# Patient Record
Sex: Female | Born: 1995 | Hispanic: No | Marital: Single | Smoking: Never smoker
Health system: Southern US, Community
[De-identification: ages and names within clinical notes are randomized; demographics above are authoritative.]

## PROBLEM LIST (undated history)

## (undated) DIAGNOSIS — Z789 Other specified health status: Secondary | ICD-10-CM

## (undated) HISTORY — PX: NO PAST SURGERIES: SHX2092

---
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
## 2018-06-02 ENCOUNTER — Encounter (HOSPITAL_COMMUNITY): Payer: Self-pay | Admitting: *Deleted

## 2018-06-06 ENCOUNTER — Encounter (HOSPITAL_COMMUNITY): Payer: Self-pay

## 2018-06-06 ENCOUNTER — Ambulatory Visit (HOSPITAL_BASED_OUTPATIENT_CLINIC_OR_DEPARTMENT_OTHER)
Admission: RE | Admit: 2018-06-06 | Discharge: 2018-06-06 | Disposition: A | Discharge status: Home / Self Care | Payer: Managed Care, Other (non HMO) | Source: Ambulatory Visit | Attending: Specialist | Admitting: Specialist

## 2018-06-06 ENCOUNTER — Other Ambulatory Visit (HOSPITAL_COMMUNITY): Payer: Self-pay | Admitting: Obstetrics and Gynecology

## 2018-06-06 ENCOUNTER — Other Ambulatory Visit (HOSPITAL_COMMUNITY): Payer: Self-pay | Admitting: *Deleted

## 2018-06-06 ENCOUNTER — Ambulatory Visit (HOSPITAL_COMMUNITY)
Admission: RE | Admit: 2018-06-06 | Discharge: 2018-06-06 | Disposition: A | Discharge status: Home / Self Care | Payer: Managed Care, Other (non HMO) | Source: Ambulatory Visit | Attending: Specialist | Admitting: Specialist

## 2018-06-06 DIAGNOSIS — Z3682 Encounter for antenatal screening for nuchal translucency: Secondary | ICD-10-CM

## 2018-06-06 DIAGNOSIS — Z3A13 13 weeks gestation of pregnancy: Secondary | ICD-10-CM

## 2018-06-06 DIAGNOSIS — Q513 Bicornate uterus: Principal | ICD-10-CM

## 2018-06-06 DIAGNOSIS — Z8489 Family history of other specified conditions: Secondary | ICD-10-CM

## 2018-06-06 DIAGNOSIS — O3402 Maternal care for unspecified congenital malformation of uterus, second trimester: Secondary | ICD-10-CM

## 2018-06-06 DIAGNOSIS — O34591 Maternal care for other abnormalities of gravid uterus, first trimester: Secondary | ICD-10-CM | POA: Diagnosis not present

## 2018-06-06 DIAGNOSIS — O34599 Maternal care for other abnormalities of gravid uterus, unspecified trimester: Secondary | ICD-10-CM | POA: Insufficient documentation

## 2018-06-06 HISTORY — DX: Other specified health status: Z78.9

## 2018-06-06 NOTE — ED Notes (Signed)
Patient in session with Genetic Counselor. 

## 2018-06-22 ENCOUNTER — Other Ambulatory Visit: Payer: Self-pay

## 2018-06-28 ENCOUNTER — Encounter (HOSPITAL_COMMUNITY): Payer: Self-pay

## 2018-07-18 ENCOUNTER — Other Ambulatory Visit (HOSPITAL_COMMUNITY): Payer: Managed Care, Other (non HMO)

## 2018-07-21 ENCOUNTER — Ambulatory Visit (HOSPITAL_COMMUNITY)
Admission: RE | Admit: 2018-07-21 | Discharge: 2018-07-21 | Disposition: A | Discharge status: Home / Self Care | Payer: Managed Care, Other (non HMO) | Source: Ambulatory Visit | Attending: Obstetrics and Gynecology | Admitting: Obstetrics and Gynecology

## 2018-07-21 ENCOUNTER — Encounter (HOSPITAL_COMMUNITY): Payer: Self-pay

## 2018-07-21 ENCOUNTER — Other Ambulatory Visit (HOSPITAL_COMMUNITY): Payer: Self-pay | Admitting: *Deleted

## 2018-07-21 DIAGNOSIS — Z3A19 19 weeks gestation of pregnancy: Secondary | ICD-10-CM | POA: Insufficient documentation

## 2018-07-21 DIAGNOSIS — Z363 Encounter for antenatal screening for malformations: Secondary | ICD-10-CM | POA: Diagnosis not present

## 2018-07-21 DIAGNOSIS — Q513 Bicornate uterus: Secondary | ICD-10-CM | POA: Insufficient documentation

## 2018-07-21 DIAGNOSIS — O34592 Maternal care for other abnormalities of gravid uterus, second trimester: Secondary | ICD-10-CM | POA: Insufficient documentation

## 2018-07-21 DIAGNOSIS — Z8279 Family history of other congenital malformations, deformations and chromosomal abnormalities: Secondary | ICD-10-CM | POA: Insufficient documentation

## 2018-07-21 DIAGNOSIS — O3400 Maternal care for unspecified congenital malformation of uterus, unspecified trimester: Secondary | ICD-10-CM

## 2018-07-21 DIAGNOSIS — O3402 Maternal care for unspecified congenital malformation of uterus, second trimester: Secondary | ICD-10-CM

## 2018-08-18 ENCOUNTER — Ambulatory Visit (HOSPITAL_COMMUNITY)
Admission: RE | Admit: 2018-08-18 | Discharge: 2018-08-18 | Disposition: A | Discharge status: Home / Self Care | Payer: BLUE CROSS/BLUE SHIELD | Source: Ambulatory Visit | Attending: Specialist | Admitting: Specialist

## 2018-08-18 ENCOUNTER — Other Ambulatory Visit (HOSPITAL_COMMUNITY): Payer: Self-pay | Admitting: *Deleted

## 2018-08-18 ENCOUNTER — Encounter (HOSPITAL_COMMUNITY): Payer: Self-pay

## 2018-08-18 DIAGNOSIS — O3400 Maternal care for unspecified congenital malformation of uterus, unspecified trimester: Secondary | ICD-10-CM

## 2018-08-18 DIAGNOSIS — Q513 Bicornate uterus: Principal | ICD-10-CM

## 2018-08-18 DIAGNOSIS — O34592 Maternal care for other abnormalities of gravid uterus, second trimester: Secondary | ICD-10-CM | POA: Diagnosis not present

## 2018-08-18 DIAGNOSIS — Z3A23 23 weeks gestation of pregnancy: Secondary | ICD-10-CM | POA: Diagnosis not present

## 2018-08-18 DIAGNOSIS — O3402 Maternal care for unspecified congenital malformation of uterus, second trimester: Secondary | ICD-10-CM | POA: Insufficient documentation

## 2018-08-18 DIAGNOSIS — Z362 Encounter for other antenatal screening follow-up: Secondary | ICD-10-CM | POA: Diagnosis not present

## 2018-09-15 ENCOUNTER — Ambulatory Visit (HOSPITAL_COMMUNITY): Payer: BLUE CROSS/BLUE SHIELD

## 2018-09-15 ENCOUNTER — Encounter (HOSPITAL_COMMUNITY): Payer: Self-pay

## 2019-03-04 ENCOUNTER — Encounter (HOSPITAL_COMMUNITY): Payer: Self-pay

## 2019-11-18 IMAGING — US US MFM OB TRANSVAGINAL
1 series · 14 of 28 positions shown · non-contrast
Comparison: none

[Series 1: us mfm ob transvaginal · 45 acquisitions, 14 frames shown]
[im 2/45]
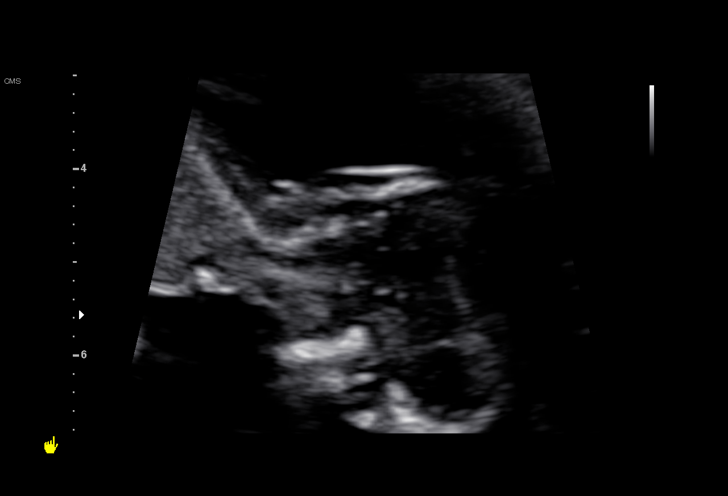
[im 5/45]
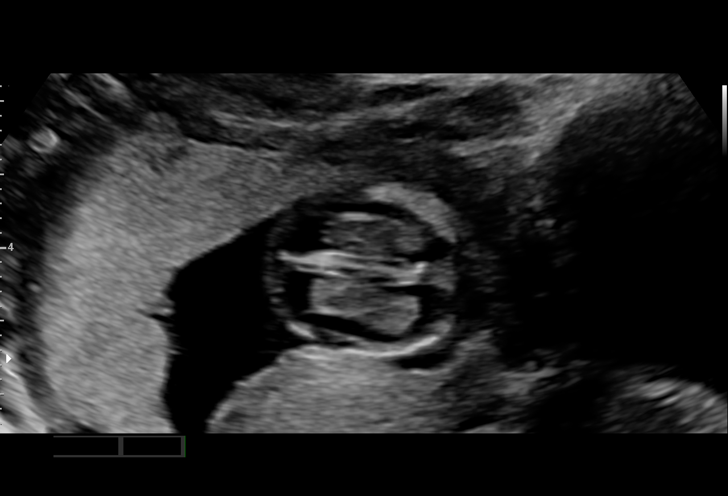
[im 9/45]
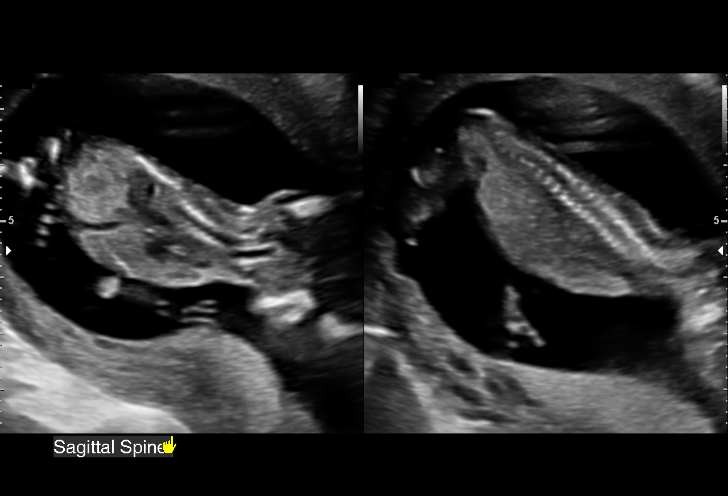
[im 12/45]
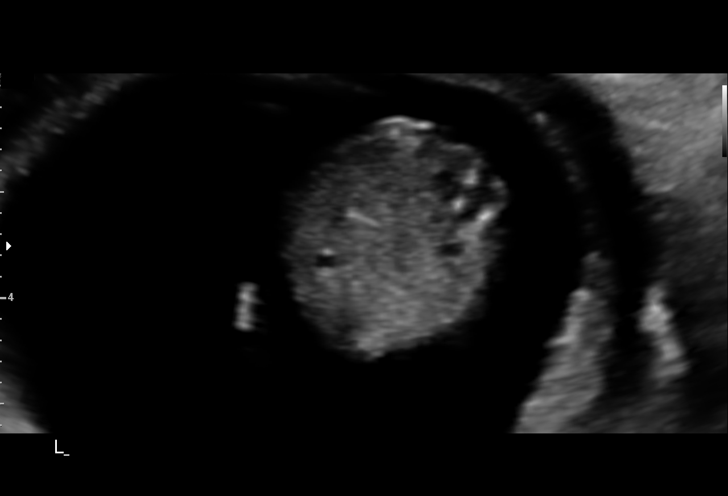
[im 15/45]
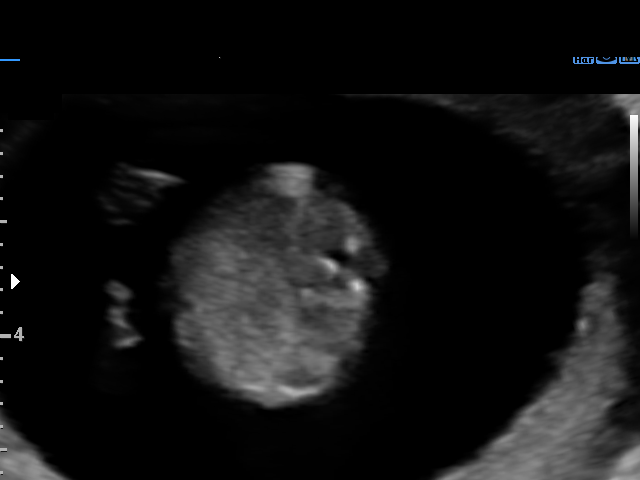
[im 18/45]
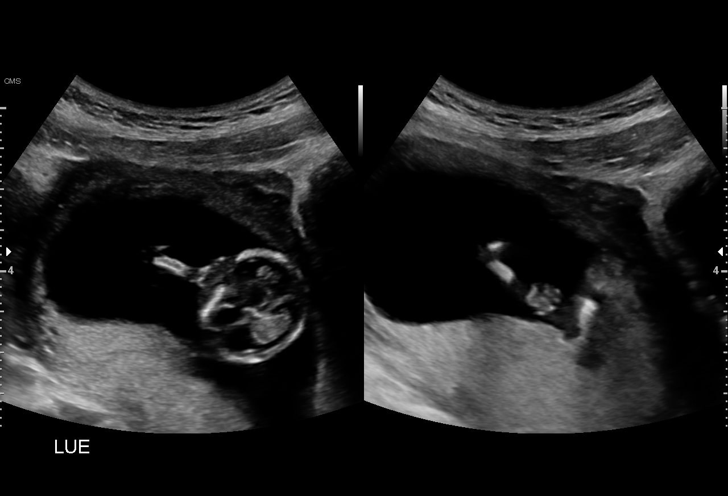
[im 22/45]
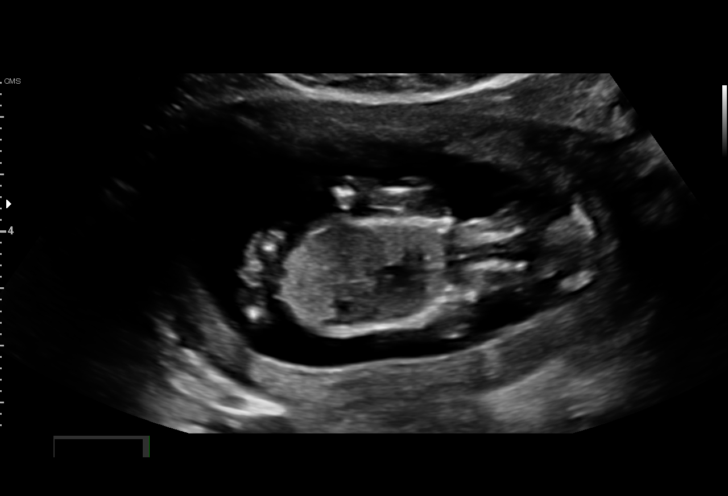
[im 25/45]
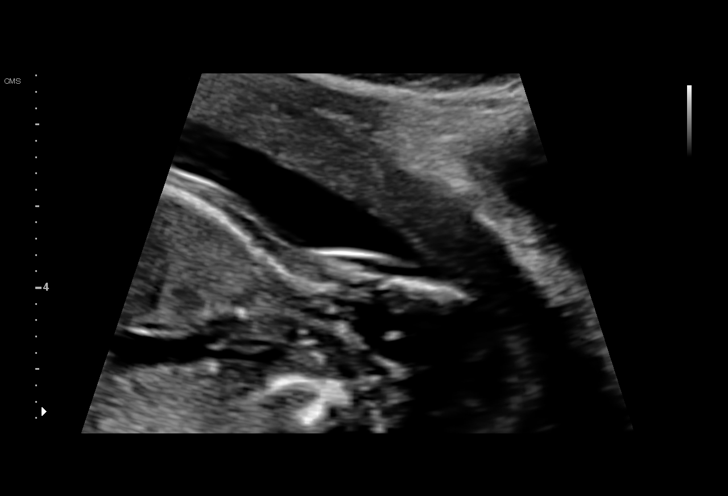
[im 28/45]
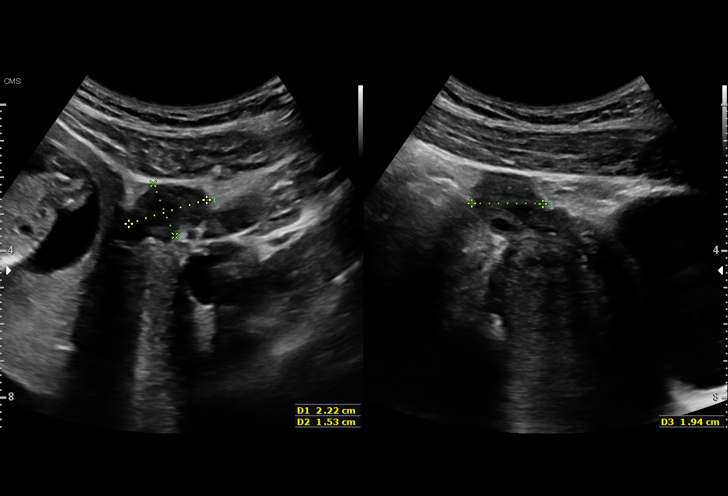
[im 31/45]
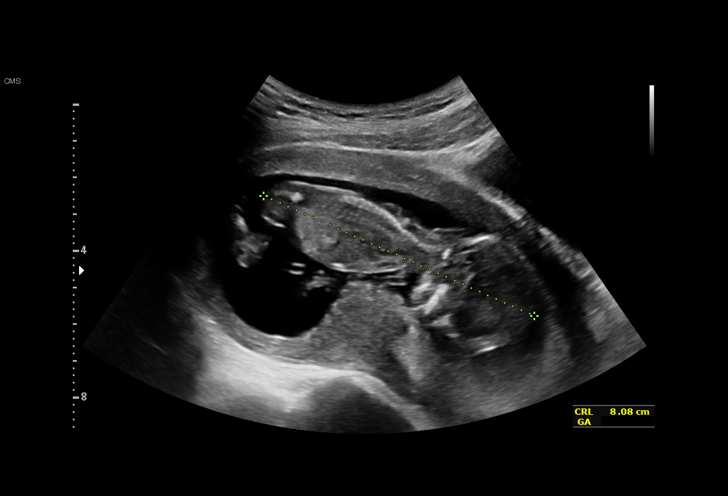
[im 35/45]
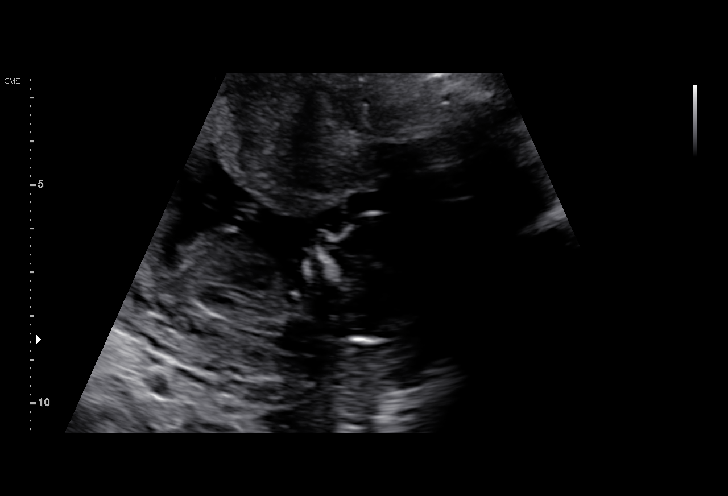
[im 38/45]
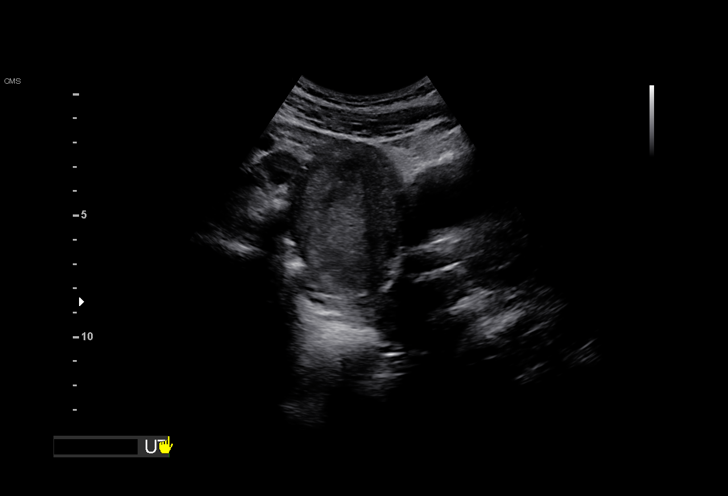
[im 41/45]
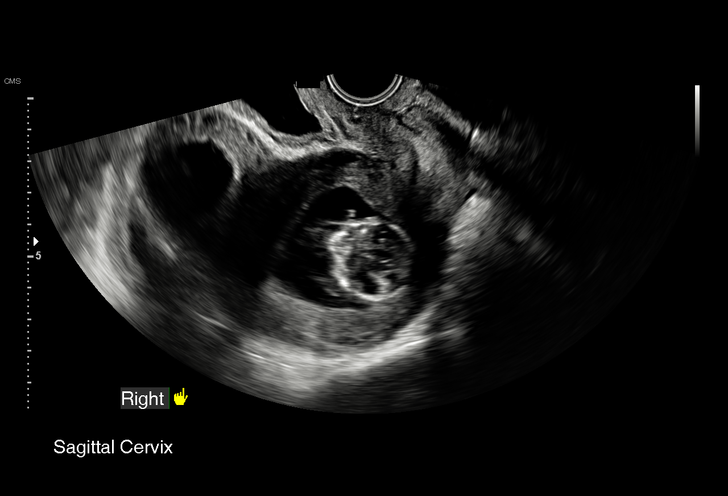
[im 45/45]
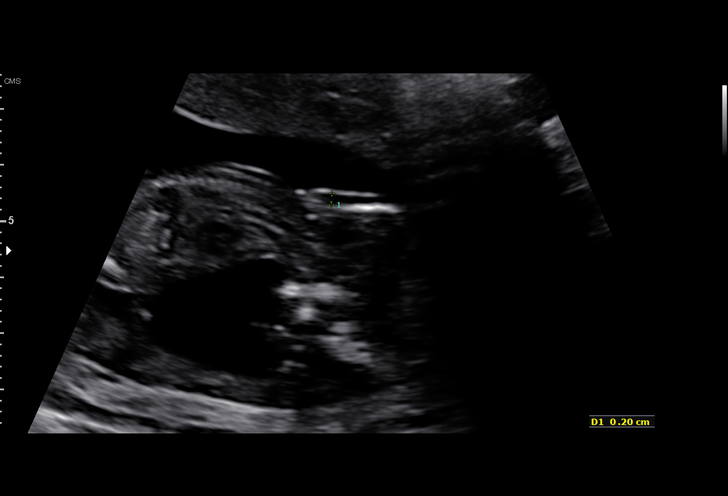

[14 of 28 positions shown; findings below may reference images not displayed]

[HOSPITAL][HOSPITAL]

TRANSLUCENCY

Indications

Encounter for nuchal translucency
Uterine abnormality during pregnancy
Family history of chromosomal abnormality
(FOB sibling with T17)
13 weeks gestation of pregnancy
Fetal Evaluation

Num Of Fetuses:         1
Fetal Heart Rate(bpm):  149
Cardiac Activity:       Observed
Biometry

CRL:      78.4  mm     G. Age:  13w 4d                  EDD:   12/08/18
OB History

Gravidity:    1         Term:   0        Prem:   0        SAB:   0
TOP:          0       Ectopic:  0        Living: 0
Gestational Age

Best:          13w 2d     Det. By:  Early Ultrasound         EDD:   12/10/18
(04/15/18)
1st Trimester Genetic Sonogram Screening

CRL:            78.4  mm    G. Age:   13w 4d                 EDD:   12/08/18
Nuc Trans:         2  mm
Nasal Bone:                 Not visualized
Cervix Uterus Adnexa

Cervix
Closed

Uterus
Bicornuate.

Left Ovary
Within normal limits.

Right Ovary
Within normal limits.

Cul De Sac
No free fluid seen.

Adnexa
No abnormality visualized.
Impression

Ms. Corsar, [id number] P0 at 13-weeks' gestation, is here for first-
trimester screening. Patient's husband's brother has trisomy
17 and the couple met with our genetic counselor today. You
will be receiving a separate letter from our genetic counselor.
Patient opted to have first-trimester screening.

On ultrasound, the CRL measurement is consistent with her
previously-established dates and good fetal heart activity is
seen. The nuchal translucency (NT) measures 2 millimeters,
which is normal.  Fetal anatomy that could be ascertained at
this gestational age is normal.

Because of suspicion of uterine anomaly, we performed
transvaginal ultrasound. Ultrasound gives the appearance of
bicornuate uterus (fundal indentation) with a single cervix.
Fetus seems more in the right horn.
Cervix appears normal.

I explained the findings with help of diagram and informed her
that uterine malformations can be associated with increased
risk of cervical incompetence, second-trimester loss (with
cervical incompetence), preterm delivery and malpresentation.

Ultrasound has limitations in accurately diagnosing uterine
malformation.
Blood was drawn today for BECHI and beta hCG.
Recommendations

-An appointment was made for her to return in 6 weeks for
fetal anatomy scan and cervical length measurement.
-We will communicate the first-trimester screening results to
the patient and fax a copy to your office.
-If patient delivers by cesarean section for obstetric
indications, photograph of uterus (and malformation) is
recommended.
-Sonohysterography at 3 months after delivery.
